# Patient Record
Sex: Male | Born: 1985 | Race: Black or African American | Hispanic: No | Marital: Single | Smoking: Current some day smoker
Health system: Southern US, Community
[De-identification: ages and names within clinical notes are randomized; demographics above are authoritative.]

---

## 2011-02-13 ENCOUNTER — Emergency Department (HOSPITAL_COMMUNITY): Payer: Self-pay

## 2011-02-13 ENCOUNTER — Emergency Department (HOSPITAL_COMMUNITY)
Admission: EM | Admit: 2011-02-13 | Discharge: 2011-02-13 | Disposition: A | Discharge status: Home / Self Care | Payer: Self-pay | Attending: Emergency Medicine | Admitting: Emergency Medicine

## 2011-02-13 DIAGNOSIS — S6390XA Sprain of unspecified part of unspecified wrist and hand, initial encounter: Secondary | ICD-10-CM | POA: Insufficient documentation

## 2011-02-13 DIAGNOSIS — M79609 Pain in unspecified limb: Secondary | ICD-10-CM | POA: Insufficient documentation

## 2011-02-13 DIAGNOSIS — Y92009 Unspecified place in unspecified non-institutional (private) residence as the place of occurrence of the external cause: Secondary | ICD-10-CM | POA: Insufficient documentation

## 2011-02-13 DIAGNOSIS — R609 Edema, unspecified: Secondary | ICD-10-CM | POA: Insufficient documentation

## 2011-02-13 DIAGNOSIS — W1809XA Striking against other object with subsequent fall, initial encounter: Secondary | ICD-10-CM | POA: Insufficient documentation

## 2012-08-04 IMAGING — CR DG FINGER THUMB 2+V*L*
3 series · 3 of 3 positions shown · non-contrast
Comparison: None.

CLINICAL DATA: Left thumb swelling and limited range of motion,
status post fall.

LEFT THUMB 2+V

[x finger pa left]
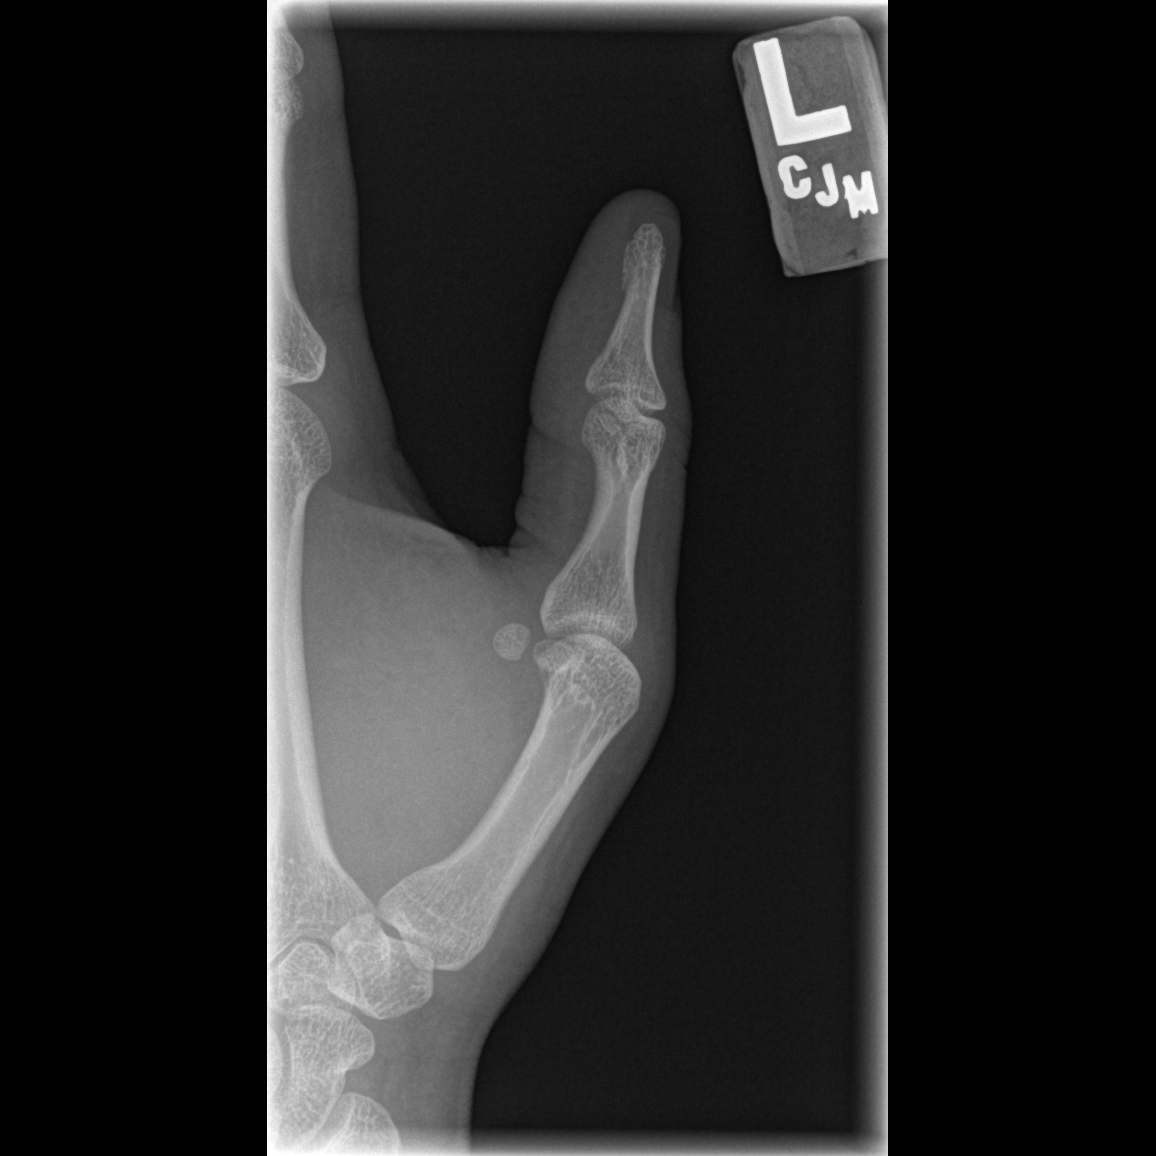

[x finger obl. left]
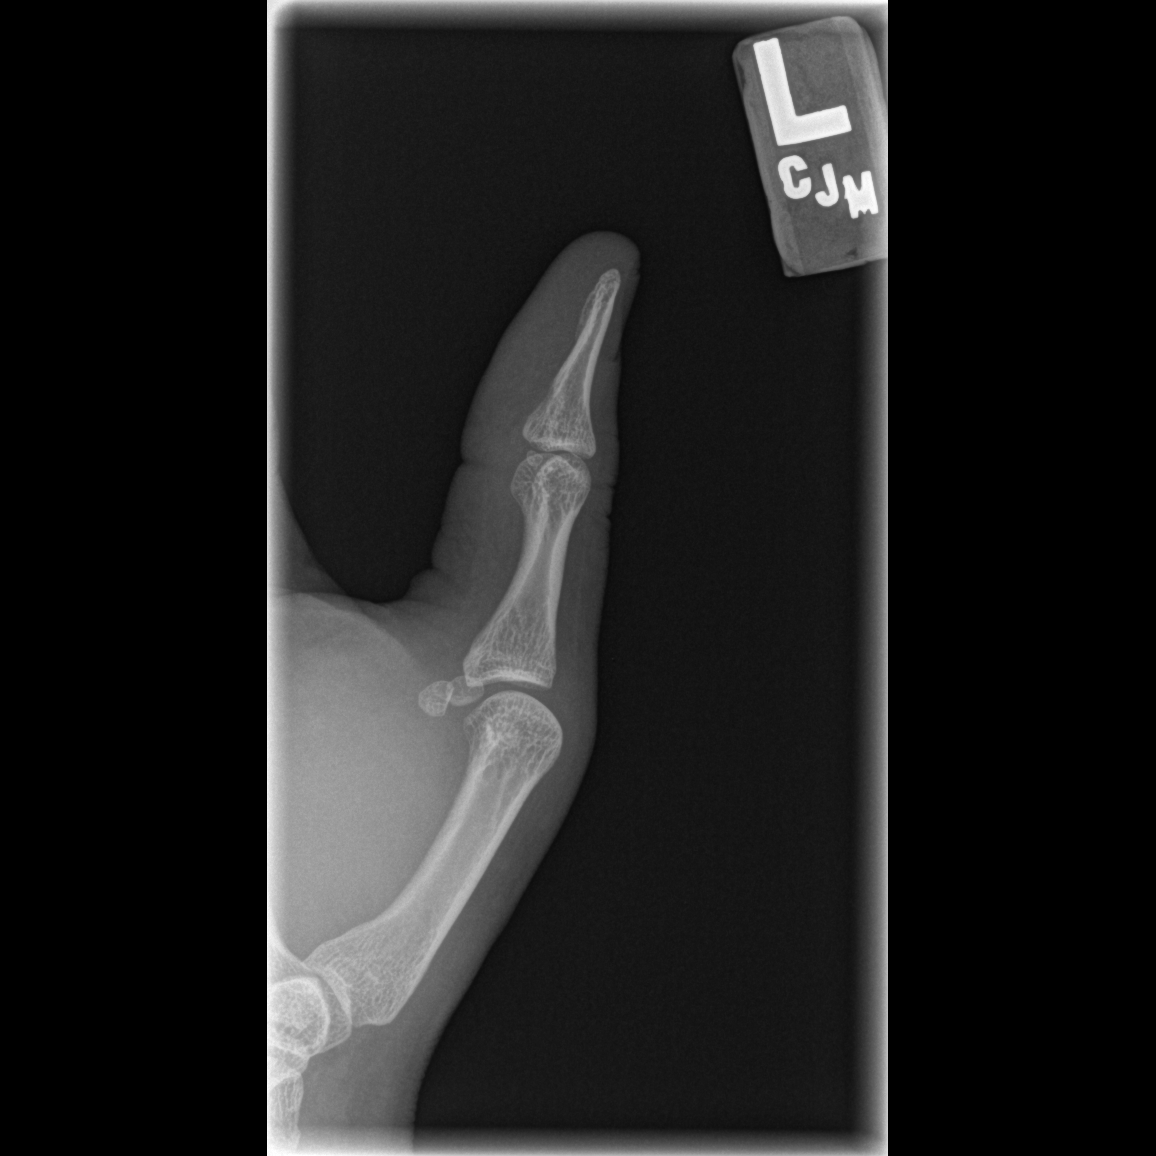

[x finger lateral left]
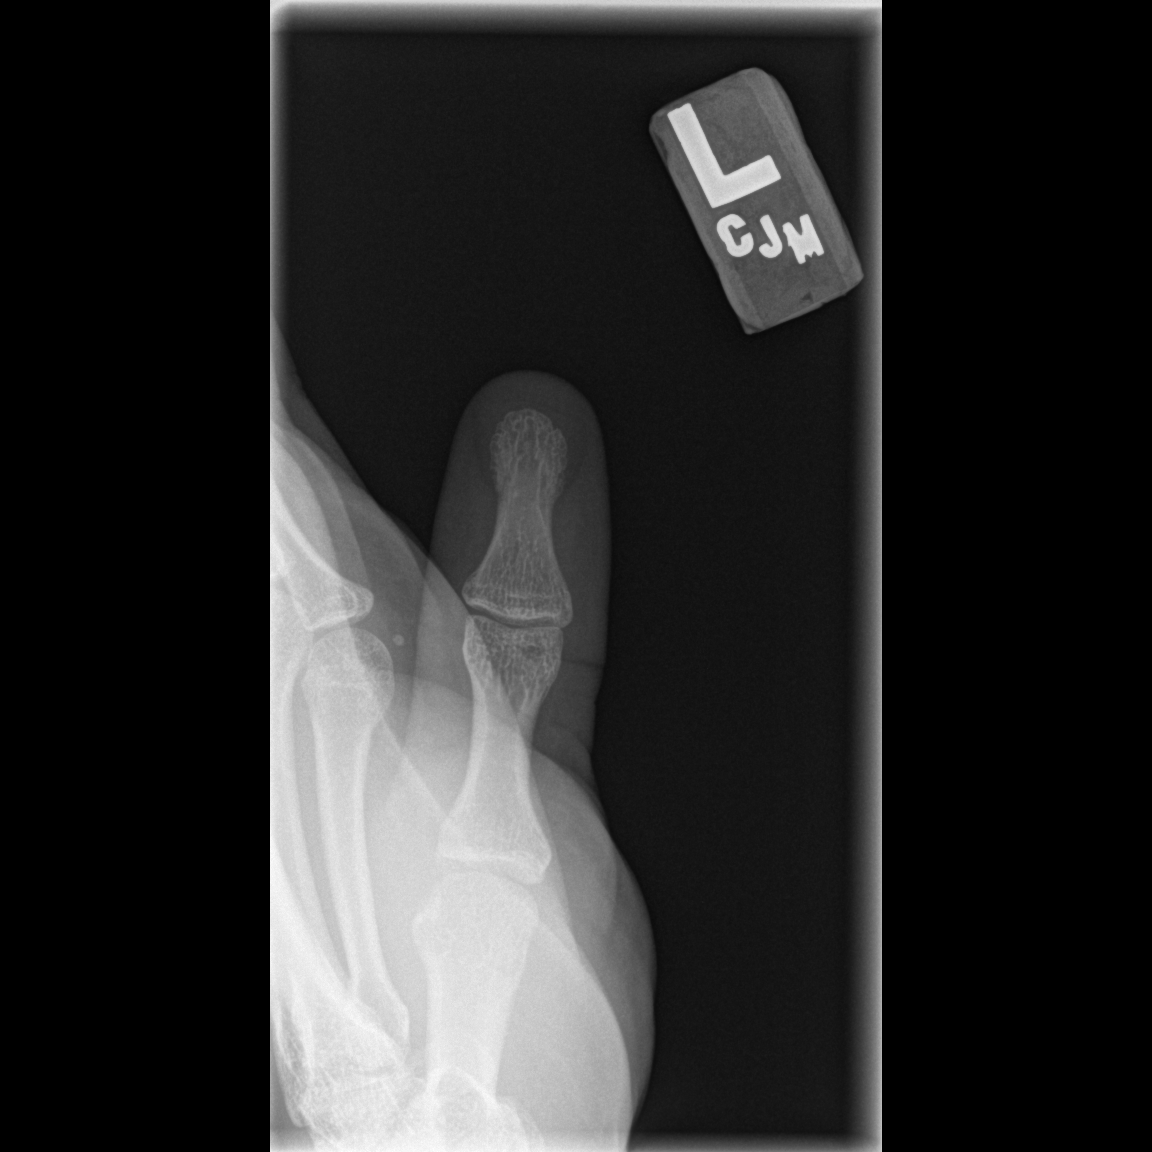

[3 of 3 positions shown; findings below may reference images not displayed]

FINDINGS: There is no evidence of fracture or dislocation.  The
left thumb is unremarkable in appearance.  Visualized joint spaces
are preserved.  No significant soft tissue abnormalities are
characterized on radiograph.
IMPRESSION: No evidence of fracture or dislocation.

## 2015-01-09 ENCOUNTER — Emergency Department (HOSPITAL_COMMUNITY)
Admission: EM | Admit: 2015-01-09 | Discharge: 2015-01-10 | Disposition: A | Discharge status: Home / Self Care | Payer: Self-pay | Attending: Emergency Medicine | Admitting: Emergency Medicine

## 2015-01-09 ENCOUNTER — Encounter (HOSPITAL_COMMUNITY): Payer: Self-pay | Admitting: *Deleted

## 2015-01-09 DIAGNOSIS — R6883 Chills (without fever): Secondary | ICD-10-CM | POA: Insufficient documentation

## 2015-01-09 DIAGNOSIS — R1031 Right lower quadrant pain: Secondary | ICD-10-CM | POA: Insufficient documentation

## 2015-01-09 DIAGNOSIS — R5383 Other fatigue: Secondary | ICD-10-CM | POA: Insufficient documentation

## 2015-01-09 DIAGNOSIS — R000 Tachycardia, unspecified: Secondary | ICD-10-CM | POA: Insufficient documentation

## 2015-01-09 DIAGNOSIS — R531 Weakness: Secondary | ICD-10-CM | POA: Insufficient documentation

## 2015-01-09 DIAGNOSIS — R1013 Epigastric pain: Secondary | ICD-10-CM | POA: Insufficient documentation

## 2015-01-09 DIAGNOSIS — R112 Nausea with vomiting, unspecified: Secondary | ICD-10-CM | POA: Insufficient documentation

## 2015-01-09 DIAGNOSIS — R197 Diarrhea, unspecified: Secondary | ICD-10-CM | POA: Insufficient documentation

## 2015-01-09 DIAGNOSIS — R630 Anorexia: Secondary | ICD-10-CM | POA: Insufficient documentation

## 2015-01-09 DIAGNOSIS — Z720 Tobacco use: Secondary | ICD-10-CM | POA: Insufficient documentation

## 2015-01-09 LAB — COMPREHENSIVE METABOLIC PANEL
ALBUMIN: 4.3 g/dL (ref 3.5–5.2)
ALT: 38 U/L (ref 0–53)
ANION GAP: 12 (ref 5–15)
AST: 24 U/L (ref 0–37)
Alkaline Phosphatase: 89 U/L (ref 39–117)
BILIRUBIN TOTAL: 0.6 mg/dL (ref 0.3–1.2)
BUN: 11 mg/dL (ref 6–23)
CALCIUM: 9.8 mg/dL (ref 8.4–10.5)
CHLORIDE: 102 mmol/L (ref 96–112)
CO2: 24 mmol/L (ref 19–32)
CREATININE: 0.97 mg/dL (ref 0.50–1.35)
GFR calc Af Amer: >90 mL/min (ref >90)
Glucose, Bld: 121 mg/dL — ABNORMAL HIGH (ref 70–99)
Potassium: 4 mmol/L (ref 3.5–5.1)
Sodium: 138 mmol/L (ref 135–145)
Total Protein: 7.5 g/dL (ref 6.0–8.3)

## 2015-01-09 LAB — URINALYSIS, ROUTINE W REFLEX MICROSCOPIC
BILIRUBIN URINE: NEGATIVE
Glucose, UA: NEGATIVE mg/dL
Hgb urine dipstick: NEGATIVE
Ketones, ur: 40 mg/dL — AB
Leukocytes, UA: NEGATIVE
NITRITE: NEGATIVE
Protein, ur: NEGATIVE mg/dL
SPECIFIC GRAVITY, URINE: 1.025 (ref 1.005–1.030)
UROBILINOGEN UA: 1 mg/dL (ref 0.0–1.0)
pH: 8 (ref 5.0–8.0)

## 2015-01-09 LAB — CBC WITH DIFFERENTIAL/PLATELET
BASOS ABS: 0 10*3/uL (ref 0.0–0.1)
BASOS PCT: 0 % (ref 0–1)
EOS ABS: 0 10*3/uL (ref 0.0–0.7)
Eosinophils Relative: 0 % (ref 0–5)
HEMATOCRIT: 44.1 % (ref 39.0–52.0)
HEMOGLOBIN: 15.7 g/dL (ref 13.0–17.0)
LYMPHS ABS: 1 10*3/uL (ref 0.7–4.0)
LYMPHS PCT: 11 % — AB (ref 12–46)
MCH: 29.1 pg (ref 26.0–34.0)
MCHC: 35.6 g/dL (ref 30.0–36.0)
MCV: 81.8 fL (ref 78.0–100.0)
MONO ABS: 0.3 10*3/uL (ref 0.1–1.0)
MONOS PCT: 4 % (ref 3–12)
NEUTROS ABS: 7.8 10*3/uL — AB (ref 1.7–7.7)
Neutrophils Relative %: 85 % — ABNORMAL HIGH (ref 43–77)
Platelets: 243 10*3/uL (ref 150–400)
RBC: 5.39 MIL/uL (ref 4.22–5.81)
RDW: 12.7 % (ref 11.5–15.5)
WBC: 9.2 10*3/uL (ref 4.0–10.5)

## 2015-01-09 LAB — I-STAT TROPONIN, ED: Troponin i, poc: 0.01 ng/mL (ref 0.00–0.08)

## 2015-01-09 LAB — LIPASE, BLOOD: LIPASE: 21 U/L (ref 11–59)

## 2015-01-09 MED ORDER — ONDANSETRON HCL 4 MG/2ML IJ SOLN
4.0000 mg | Freq: Once | INTRAMUSCULAR | Status: AC
  Administered 2015-01-09: 4 mg via INTRAVENOUS
  Filled 2015-01-09: qty 2

## 2015-01-09 MED ORDER — FENTANYL CITRATE (PF) 100 MCG/2ML IJ SOLN
50.0000 ug | Freq: Once | INTRAMUSCULAR | Status: AC
  Administered 2015-01-09: 50 ug via INTRAVENOUS
  Filled 2015-01-09: qty 2

## 2015-01-09 MED ORDER — SODIUM CHLORIDE 0.9 % IV BOLUS (SEPSIS)
1000.0000 mL | Freq: Once | INTRAVENOUS | Status: AC
  Administered 2015-01-10: 1000 mL via INTRAVENOUS

## 2015-01-09 MED ORDER — SODIUM CHLORIDE 0.9 % IV BOLUS (SEPSIS)
1000.0000 mL | Freq: Once | INTRAVENOUS | Status: AC
  Administered 2015-01-09: 1000 mL via INTRAVENOUS

## 2015-01-09 NOTE — ED Provider Notes (Signed)
CSN: [id number]     Arrival date & time 01/09/15  1845 History   First MD Initiated Contact with Patient 01/09/15 2213     Chief Complaint  Patient presents with  . Abdominal Pain     (Consider location/radiation/quality/duration/timing/severity/associated sxs/prior Treatment) Patient is a 29 y.o. male presenting with abdominal pain. The history is provided by the patient.  Abdominal Pain Associated symptoms: chills, diarrhea, fatigue, nausea and vomiting   Associated symptoms: no fever and no shortness of breath    patient with nausea vomiting and diarrhea. Began yesterday. Worrisome lytic to been some bad food he ate. Has had some chills. Has had upper abdominal pain. Has had some yellow emesis. Has had watery diarrhea. Some chills without clear fevers. He's had a decreased appetite. He is otherwise healthy. The pain is dull. Is worse in the right lower quadrant and epigastric area.  History reviewed. No pertinent past medical history. History reviewed. No pertinent past surgical history. No family history on file. History  Substance Use Topics  . Smoking status: Current Some Day Smoker    Types: Cigarettes  . Smokeless tobacco: Current User    Types: Chew  . Alcohol Use: No    Review of Systems  Constitutional: Positive for chills and fatigue. Negative for fever.  Respiratory: Negative for shortness of breath.   Gastrointestinal: Positive for nausea, vomiting, abdominal pain and diarrhea.  Musculoskeletal: Negative for back pain.  Neurological: Positive for weakness. Negative for headaches.  Hematological: Negative for adenopathy.  Psychiatric/Behavioral: Negative for confusion.      Allergies  Review of patient's allergies indicates no known allergies.  Home Medications   Prior to Admission medications   Medication Sig Start Date End Date Taking? Authorizing Provider  ondansetron (ZOFRAN-ODT) 8 MG disintegrating tablet Take 1 tablet (8 mg total) by mouth every 8  (eight) hours as needed for nausea or vomiting. 01/10/15   Benjiman CoreNathan Ardyth Kelso, MD   BP 134/77 mmHg  Pulse 102  Temp(Src) 98.1 F (36.7 C)  Resp 18  Wt 192 lb (87.091 kg)  SpO2 99% Physical Exam  Constitutional: He appears well-developed.  Patient appears uncomfortable  HENT:  Head: Atraumatic.  Cardiovascular:  Mild tachycardia  Abdominal: There is tenderness.  Epigastric and right lower quadrant tenderness. No hernias. No rebound or guarding. No masses.  Musculoskeletal: Normal range of motion.  Neurological: He is alert.  Skin: Skin is warm.    ED Course  Procedures (including critical care time) Labs Review Labs Reviewed  CBC WITH DIFFERENTIAL/PLATELET - Abnormal; Notable for the following:    Neutrophils Relative % 85 (*)    Neutro Abs 7.8 (*)    Lymphocytes Relative 11 (*)    All other components within normal limits  COMPREHENSIVE METABOLIC PANEL - Abnormal; Notable for the following:    Glucose, Bld 121 (*)    All other components within normal limits  URINALYSIS, ROUTINE W REFLEX MICROSCOPIC - Abnormal; Notable for the following:    Ketones, ur 40 (*)    All other components within normal limits  LIPASE, BLOOD  I-STAT TROPOININ, ED    Imaging Review No results found.   EKG Interpretation None      MDM   Final diagnoses:  Nausea vomiting and diarrhea    Patient nausea vomiting diarrhea and some abdominal pain. Feels better after treatment. Has tolerated orals. Vitals improved. Will discharge home.    Benjiman CoreNathan Mikel Pyon, MD 01/10/15 [id number]

## 2015-01-09 NOTE — ED Notes (Signed)
Pt c/o abd pain and vomiting. Denies diarrhea. States he took some pepcid with no relief.

## 2015-01-10 MED ORDER — ONDANSETRON 8 MG PO TBDP: 8.0000 mg | ORAL_TABLET | Freq: Three times a day (TID) | ORAL | Status: AC | PRN

## 2015-01-10 NOTE — Discharge Instructions (Signed)

## 2015-01-10 NOTE — ED Notes (Signed)
Patient is asleep.  

## 2015-01-10 NOTE — ED Notes (Signed)
Patient is alert and orientedx4.  Patient was explained discharge instructions and they understood them with no questions.  The patient's friend, Newell CoralMumamed Mergani, is taking the patient home.

## 2015-05-29 ENCOUNTER — Emergency Department (HOSPITAL_COMMUNITY)
Admission: EM | Admit: 2015-05-29 | Discharge: 2015-05-29 | Disposition: A | Discharge status: Home / Self Care | Payer: No Typology Code available for payment source | Attending: Emergency Medicine | Admitting: Emergency Medicine

## 2015-05-29 ENCOUNTER — Encounter (HOSPITAL_COMMUNITY): Payer: Self-pay | Admitting: Emergency Medicine

## 2015-05-29 DIAGNOSIS — S0083XA Contusion of other part of head, initial encounter: Secondary | ICD-10-CM | POA: Insufficient documentation

## 2015-05-29 DIAGNOSIS — Y9389 Activity, other specified: Secondary | ICD-10-CM | POA: Insufficient documentation

## 2015-05-29 DIAGNOSIS — Y92410 Unspecified street and highway as the place of occurrence of the external cause: Secondary | ICD-10-CM | POA: Insufficient documentation

## 2015-05-29 DIAGNOSIS — S0990XA Unspecified injury of head, initial encounter: Secondary | ICD-10-CM | POA: Diagnosis present

## 2015-05-29 DIAGNOSIS — Z720 Tobacco use: Secondary | ICD-10-CM | POA: Insufficient documentation

## 2015-05-29 DIAGNOSIS — S199XXA Unspecified injury of neck, initial encounter: Secondary | ICD-10-CM | POA: Insufficient documentation

## 2015-05-29 DIAGNOSIS — Y998 Other external cause status: Secondary | ICD-10-CM | POA: Diagnosis not present

## 2015-05-29 MED ORDER — IBUPROFEN 800 MG PO TABS: 800.0000 mg | ORAL_TABLET | Freq: Three times a day (TID) | ORAL | Status: AC

## 2015-05-29 MED ORDER — IBUPROFEN 800 MG PO TABS
800.0000 mg | ORAL_TABLET | Freq: Once | ORAL | Status: AC
  Administered 2015-05-29: 800 mg via ORAL
  Filled 2015-05-29: qty 1

## 2015-05-29 NOTE — Progress Notes (Addendum)
CM spoke with pt who confirms uninsured [employer] resident with no pcp.  CM discussed and provided written information for uninsured accepting pcps, discussed the importance of pcp vs EDP services for f/u care, www.needymeds.org, www.goodrx.com, discounted pharmacies and other [employer] such as [facility] , [facility], affordable care act, financial assistance, uninsured dental services, [place] med assist, DSS and  health department  Reviewed resources for [employer] uninsured accepting pcps like [facility], family medicine at [facility] [employer] [facility], [facility], [facility], local urgent care centers, [facility], [facility] family practice, general medical clinics, family services of the [place], [facility] urgent care plus others, medication resources, [facility] out patient pharmacies and housing Pt voiced understanding and appreciation of resources provided   Provided [facility] contact information Pt agreed to a referral Cm completed referral Pt to be contact by [facility] clinical liason Pt inquired about the time frame of ED visit. Pt has not had labs completed Cm informed pt minimal of 2 hours plus
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
## 2015-05-29 NOTE — Discharge Instructions (Signed)
Contusion °A contusion is a deep bruise. Contusions are the result of an injury that caused bleeding under the skin. The contusion may turn blue, purple, or yellow. Minor injuries will give you a painless contusion, but more severe contusions may stay painful and swollen for a few weeks.  °CAUSES  °A contusion is usually caused by a blow, trauma, or direct force to an area of the body. °SYMPTOMS  °· Swelling and redness of the injured area. °· Bruising of the injured area. °· Tenderness and soreness of the injured area. °· Pain. °DIAGNOSIS  °The diagnosis can be made by taking a history and physical exam. An X-ray, CT scan, or MRI may be needed to determine if there were any associated injuries, such as fractures. °TREATMENT  °Specific treatment will depend on what area of the body was injured. In general, the best treatment for a contusion is resting, icing, elevating, and applying cold compresses to the injured area. Over-the-counter medicines may also be recommended for pain control. Ask your caregiver what the best treatment is for your contusion. °HOME CARE INSTRUCTIONS  °· Put ice on the injured area. °¨ Put ice in a plastic bag. °¨ Place a towel between your skin and the bag. °¨ Leave the ice on for 15-20 minutes, 3-4 times a day, or as directed by your health care provider. °· Only take over-the-counter or prescription medicines for pain, discomfort, or fever as directed by your caregiver. Your caregiver may recommend avoiding anti-inflammatory medicines (aspirin, ibuprofen, and naproxen) for 48 hours because these medicines may increase bruising. °· Rest the injured area. °· If possible, elevate the injured area to reduce swelling. °SEEK IMMEDIATE MEDICAL CARE IF:  °· You have increased bruising or swelling. °· You have pain that is getting worse. °· Your swelling or pain is not relieved with medicines. °MAKE SURE YOU:  °· Understand these instructions. °· Will watch your condition. °· Will get help right  away if you are not doing well or get worse. °Document Released: 06/10/2005 Document Revised: 09/05/2013 Document Reviewed: 07/06/2011 °[employer]® Patient Information ©2015 [employer]. This information is not intended to replace advice given to you by your health care provider. Make sure you discuss any questions you have with your health care provider. ° °
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
